# Patient Record
Sex: Female | Born: 1985 | Race: Black or African American | Hispanic: No | Marital: Single | Smoking: Never smoker
Health system: Southern US, Community
[De-identification: ages and names within clinical notes are randomized; demographics above are authoritative.]

## PROBLEM LIST (undated history)

## (undated) ENCOUNTER — Inpatient Hospital Stay (HOSPITAL_COMMUNITY): Payer: Self-pay

## (undated) DIAGNOSIS — D649 Anemia, unspecified: Secondary | ICD-10-CM

## (undated) DIAGNOSIS — F329 Major depressive disorder, single episode, unspecified: Secondary | ICD-10-CM

## (undated) DIAGNOSIS — F32A Depression, unspecified: Secondary | ICD-10-CM

## (undated) HISTORY — PX: NO PAST SURGERIES: SHX2092

---

## 2018-03-02 ENCOUNTER — Inpatient Hospital Stay (HOSPITAL_COMMUNITY)
Admission: AD | Admit: 2018-03-02 | Discharge: 2018-03-06 | DRG: 832 | Disposition: A | Discharge status: Home / Self Care | Payer: Medicaid Other | Attending: Obstetrics & Gynecology | Admitting: Obstetrics & Gynecology

## 2018-03-02 ENCOUNTER — Inpatient Hospital Stay (HOSPITAL_BASED_OUTPATIENT_CLINIC_OR_DEPARTMENT_OTHER): Payer: Medicaid Other

## 2018-03-02 ENCOUNTER — Encounter (HOSPITAL_COMMUNITY): Payer: Self-pay | Admitting: Emergency Medicine

## 2018-03-02 DIAGNOSIS — O0933 Supervision of pregnancy with insufficient antenatal care, third trimester: Secondary | ICD-10-CM

## 2018-03-02 DIAGNOSIS — Z6791 Unspecified blood type, Rh negative: Secondary | ICD-10-CM

## 2018-03-02 DIAGNOSIS — O0993 Supervision of high risk pregnancy, unspecified, third trimester: Secondary | ICD-10-CM

## 2018-03-02 DIAGNOSIS — O98313 Other infections with a predominantly sexual mode of transmission complicating pregnancy, third trimester: Secondary | ICD-10-CM | POA: Diagnosis present

## 2018-03-02 DIAGNOSIS — O99820 Streptococcus B carrier state complicating pregnancy: Secondary | ICD-10-CM | POA: Diagnosis present

## 2018-03-02 DIAGNOSIS — Z3A31 31 weeks gestation of pregnancy: Secondary | ICD-10-CM | POA: Diagnosis not present

## 2018-03-02 DIAGNOSIS — O30043 Twin pregnancy, dichorionic/diamniotic, third trimester: Secondary | ICD-10-CM

## 2018-03-02 DIAGNOSIS — O26893 Other specified pregnancy related conditions, third trimester: Secondary | ICD-10-CM | POA: Diagnosis present

## 2018-03-02 DIAGNOSIS — O4700 False labor before 37 completed weeks of gestation, unspecified trimester: Secondary | ICD-10-CM | POA: Diagnosis present

## 2018-03-02 DIAGNOSIS — T7491XA Unspecified adult maltreatment, confirmed, initial encounter: Secondary | ICD-10-CM | POA: Diagnosis present

## 2018-03-02 DIAGNOSIS — O479 False labor, unspecified: Secondary | ICD-10-CM | POA: Diagnosis not present

## 2018-03-02 DIAGNOSIS — O289 Unspecified abnormal findings on antenatal screening of mother: Secondary | ICD-10-CM | POA: Diagnosis not present

## 2018-03-02 DIAGNOSIS — Z3689 Encounter for other specified antenatal screening: Secondary | ICD-10-CM

## 2018-03-02 DIAGNOSIS — O4703 False labor before 37 completed weeks of gestation, third trimester: Secondary | ICD-10-CM

## 2018-03-02 DIAGNOSIS — A5901 Trichomonal vulvovaginitis: Secondary | ICD-10-CM | POA: Diagnosis present

## 2018-03-02 DIAGNOSIS — A599 Trichomoniasis, unspecified: Secondary | ICD-10-CM | POA: Diagnosis present

## 2018-03-02 DIAGNOSIS — Z789 Other specified health status: Secondary | ICD-10-CM | POA: Diagnosis present

## 2018-03-02 DIAGNOSIS — O288 Other abnormal findings on antenatal screening of mother: Secondary | ICD-10-CM

## 2018-03-02 HISTORY — DX: Anemia, unspecified: D64.9

## 2018-03-02 HISTORY — DX: Major depressive disorder, single episode, unspecified: F32.9

## 2018-03-02 HISTORY — DX: Depression, unspecified: F32.A

## 2018-03-02 LAB — URINALYSIS, ROUTINE W REFLEX MICROSCOPIC
Bilirubin Urine: NEGATIVE
GLUCOSE, UA: NEGATIVE mg/dL
Ketones, ur: 20 mg/dL — AB
Nitrite: NEGATIVE
Protein, ur: 30 mg/dL — AB
RBC / HPF: >50 RBC/hpf — ABNORMAL HIGH (ref 0–5)
Specific Gravity, Urine: 1.017 (ref 1.005–1.030)
WBC, UA: >50 WBC/hpf — ABNORMAL HIGH (ref 0–5)
pH: 6 (ref 5.0–8.0)

## 2018-03-02 LAB — FETAL FIBRONECTIN: Fetal Fibronectin: NEGATIVE

## 2018-03-02 LAB — WET PREP, GENITAL
Clue Cells Wet Prep HPF POC: NONE SEEN
Sperm: NONE SEEN
Yeast Wet Prep HPF POC: NONE SEEN

## 2018-03-02 LAB — RAPID URINE DRUG SCREEN, HOSP PERFORMED
Amphetamines: NOT DETECTED
Barbiturates: NOT DETECTED
Benzodiazepines: NOT DETECTED
Cocaine: NOT DETECTED
Opiates: NOT DETECTED
Tetrahydrocannabinol: NOT DETECTED

## 2018-03-02 LAB — AMNISURE RUPTURE OF MEMBRANE (ROM) NOT AT ARMC: Amnisure ROM: NEGATIVE

## 2018-03-02 MED ORDER — METRONIDAZOLE 500 MG PO TABS
2000.0000 mg | ORAL_TABLET | Freq: Once | ORAL | Status: AC
  Administered 2018-03-02: 2000 mg via ORAL
  Filled 2018-03-02: qty 4

## 2018-03-02 MED ORDER — BETAMETHASONE SOD PHOS & ACET 6 (3-3) MG/ML IJ SUSP
12.0000 mg | Freq: Once | INTRAMUSCULAR | Status: AC
  Administered 2018-03-02: 12 mg via INTRAMUSCULAR
  Filled 2018-03-02: qty 2

## 2018-03-02 MED ORDER — LACTATED RINGERS IV BOLUS
1000.0000 mL | Freq: Once | INTRAVENOUS | Status: AC
  Administered 2018-03-02: 1000 mL via INTRAVENOUS

## 2018-03-02 MED ORDER — NIFEDIPINE 10 MG PO CAPS
10.0000 mg | ORAL_CAPSULE | ORAL | Status: AC
  Administered 2018-03-02 (×3): 10 mg via ORAL
  Filled 2018-03-02 (×3): qty 1
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
## 2018-03-03 ENCOUNTER — Encounter (HOSPITAL_COMMUNITY): Payer: Self-pay

## 2018-03-03 ENCOUNTER — Inpatient Hospital Stay (HOSPITAL_BASED_OUTPATIENT_CLINIC_OR_DEPARTMENT_OTHER): Payer: Medicaid Other

## 2018-03-03 ENCOUNTER — Other Ambulatory Visit: Payer: Self-pay

## 2018-03-03 DIAGNOSIS — O30043 Twin pregnancy, dichorionic/diamniotic, third trimester: Secondary | ICD-10-CM

## 2018-03-03 DIAGNOSIS — O479 False labor, unspecified: Secondary | ICD-10-CM | POA: Diagnosis present

## 2018-03-03 DIAGNOSIS — O289 Unspecified abnormal findings on antenatal screening of mother: Secondary | ICD-10-CM | POA: Diagnosis not present

## 2018-03-03 DIAGNOSIS — Z789 Other specified health status: Secondary | ICD-10-CM | POA: Diagnosis present

## 2018-03-03 DIAGNOSIS — T7491XA Unspecified adult maltreatment, confirmed, initial encounter: Secondary | ICD-10-CM | POA: Diagnosis present

## 2018-03-03 DIAGNOSIS — O4700 False labor before 37 completed weeks of gestation, unspecified trimester: Secondary | ICD-10-CM | POA: Diagnosis present

## 2018-03-03 DIAGNOSIS — O26893 Other specified pregnancy related conditions, third trimester: Secondary | ICD-10-CM | POA: Diagnosis present

## 2018-03-03 DIAGNOSIS — O0933 Supervision of pregnancy with insufficient antenatal care, third trimester: Secondary | ICD-10-CM

## 2018-03-03 DIAGNOSIS — A5901 Trichomonal vulvovaginitis: Secondary | ICD-10-CM | POA: Diagnosis present

## 2018-03-03 DIAGNOSIS — Z3A31 31 weeks gestation of pregnancy: Secondary | ICD-10-CM

## 2018-03-03 DIAGNOSIS — Z6791 Unspecified blood type, Rh negative: Secondary | ICD-10-CM | POA: Diagnosis not present

## 2018-03-03 DIAGNOSIS — O99820 Streptococcus B carrier state complicating pregnancy: Secondary | ICD-10-CM | POA: Diagnosis present

## 2018-03-03 DIAGNOSIS — A599 Trichomoniasis, unspecified: Secondary | ICD-10-CM | POA: Diagnosis present

## 2018-03-03 DIAGNOSIS — O98313 Other infections with a predominantly sexual mode of transmission complicating pregnancy, third trimester: Secondary | ICD-10-CM | POA: Diagnosis present

## 2018-03-03 LAB — CBC
HCT: 26.2 % — ABNORMAL LOW (ref 36.0–46.0)
Hemoglobin: 7.9 g/dL — ABNORMAL LOW (ref 12.0–15.0)
MCH: 22.9 pg — AB (ref 26.0–34.0)
MCHC: 30.2 g/dL (ref 30.0–36.0)
MCV: 75.9 fL — ABNORMAL LOW (ref 80.0–100.0)
Platelets: 283 10*3/uL (ref 150–400)
RBC: 3.45 MIL/uL — ABNORMAL LOW (ref 3.87–5.11)
RDW: 14.5 % (ref 11.5–15.5)
WBC: 7.1 10*3/uL (ref 4.0–10.5)
nRBC: 0 % (ref 0.0–0.2)

## 2018-03-03 LAB — DIFFERENTIAL
Basophils Absolute: 0 10*3/uL (ref 0.0–0.1)
Basophils Relative: 0 %
EOS PCT: 0 %
Eosinophils Absolute: 0 10*3/uL (ref 0.0–0.5)
LYMPHS PCT: 14 %
Lymphs Abs: 1 10*3/uL (ref 0.7–4.0)
Monocytes Absolute: 0.1 10*3/uL (ref 0.1–1.0)
Monocytes Relative: 1 %
Neutro Abs: 6 10*3/uL (ref 1.7–7.7)
Neutrophils Relative %: 85 %

## 2018-03-03 LAB — HEMOGLOBIN A1C
Hgb A1c MFr Bld: 5.3 % (ref 4.8–5.6)
Mean Plasma Glucose: 105.41 mg/dL

## 2018-03-03 LAB — TYPE AND SCREEN
ABO/RH(D): A NEG
Antibody Screen: NEGATIVE

## 2018-03-03 LAB — HEPATITIS B SURFACE ANTIGEN: Hepatitis B Surface Ag: NEGATIVE

## 2018-03-03 LAB — ABO/RH: ABO/RH(D): A NEG

## 2018-03-03 MED ORDER — LACTATED RINGERS IV SOLN
INTRAVENOUS | Status: DC
  Administered 2018-03-03 – 2018-03-04 (×5): via INTRAVENOUS
  Administered 2018-03-04: 999 mL via INTRAVENOUS

## 2018-03-03 MED ORDER — ACETAMINOPHEN 325 MG PO TABS: 650.0000 mg | ORAL_TABLET | ORAL | Status: DC | PRN

## 2018-03-03 MED ORDER — BETAMETHASONE SOD PHOS & ACET 6 (3-3) MG/ML IJ SUSP
12.0000 mg | Freq: Once | INTRAMUSCULAR | Status: AC
  Administered 2018-03-03: 12 mg via INTRAMUSCULAR
  Filled 2018-03-03: qty 2

## 2018-03-03 MED ORDER — ZOLPIDEM TARTRATE 5 MG PO TABS: 5.0000 mg | ORAL_TABLET | Freq: Every evening | ORAL | Status: DC | PRN

## 2018-03-03 MED ORDER — NIFEDIPINE ER OSMOTIC RELEASE 30 MG PO TB24
30.0000 mg | ORAL_TABLET | Freq: Two times a day (BID) | ORAL | Status: DC
  Administered 2018-03-03 – 2018-03-06 (×8): 30 mg via ORAL
  Filled 2018-03-03 (×8): qty 1

## 2018-03-03 MED ORDER — CALCIUM CARBONATE ANTACID 500 MG PO CHEW: 2.0000 | CHEWABLE_TABLET | ORAL | Status: DC | PRN

## 2018-03-03 MED ORDER — DOCUSATE SODIUM 100 MG PO CAPS
100.0000 mg | ORAL_CAPSULE | Freq: Every day | ORAL | Status: DC
  Administered 2018-03-04 – 2018-03-06 (×3): 100 mg via ORAL
  Filled 2018-03-03 (×3): qty 1

## 2018-03-03 MED ORDER — PRENATAL MULTIVITAMIN CH
1.0000 | ORAL_TABLET | Freq: Every day | ORAL | Status: DC
  Administered 2018-03-03 – 2018-03-06 (×4): 1 via ORAL
  Filled 2018-03-03 (×4): qty 1
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
## 2018-03-04 LAB — HIV ANTIBODY (ROUTINE TESTING W REFLEX): HIV Screen 4th Generation wRfx: NONREACTIVE

## 2018-03-04 LAB — CULTURE, BETA STREP (GROUP B ONLY)

## 2018-03-04 LAB — RUBELLA SCREEN: Rubella: 2.51 index (ref >0.99)

## 2018-03-04 LAB — RPR: RPR Ser Ql: NONREACTIVE

## 2018-03-04 LAB — CULTURE, OB URINE: Culture: 30000 — AB

## 2018-03-04 LAB — OB RESULTS CONSOLE GBS: GBS: POSITIVE
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
## 2018-03-05 DIAGNOSIS — Z789 Other specified health status: Secondary | ICD-10-CM

## 2018-03-05 DIAGNOSIS — O99820 Streptococcus B carrier state complicating pregnancy: Secondary | ICD-10-CM

## 2018-03-05 LAB — GC/CHLAMYDIA PROBE AMP (~~LOC~~) NOT AT ARMC
Chlamydia: NEGATIVE
Neisseria Gonorrhea: NEGATIVE

## 2018-03-05 NOTE — Progress Notes (Signed)
Bedside US done for position.  A: maternal left, vtx B: maternal right, vtx   Baldemar LenisK. Meryl Marranda Arakelian, M.D. Attending Center for [facility] Midwife(Faculty Practice)

## 2018-03-05 NOTE — Progress Notes (Addendum)
FACULTY PRACTICE ANTEPARTUM PROGRESS NOTE  Marco Adelson is a 32 y.o. [id number] at [redacted]w[redacted]d who is admitted for di/di twins with pre-term contractions.  Estimated Date of Delivery: 04/30/18 Fetal presentation is cephalic and variable.  Length of Stay:  2 Days. Admitted 03/02/2018  Subjective: Still awaiting disposition planning, no room available yet at shelters contracted for today. Patient reports normal fetal movement.  She denies uterine contractions, denies bleeding and leaking of fluid per vagina. States she is feeling well.   Vitals:  Blood pressure 123/72, pulse (!) 121, temperature 99.5 F (37.5 C), resp. rate 18, height 5\' 6"  (1.676 m), weight 87.5 kg, last menstrual period 07/24/2017, SpO2 98 %. Physical Examination: CONSTITUTIONAL: Well-developed, well-nourished female in no acute distress.  HENT:  Normocephalic, atraumatic, External right and left ear normal. Oropharynx is clear and moist EYES: Conjunctivae and EOM are normal. Pupils are equal, round, and reactive to light. No scleral icterus.  NECK: Normal range of motion, supple, no masses. SKIN: Skin is warm and dry. No rash noted. Not diaphoretic. No erythema. No pallor. NEUROLGIC: Alert and oriented to person, place, and time. Normal reflexes, muscle tone coordination. No cranial nerve deficit noted. PSYCHIATRIC: Normal mood and affect. Normal behavior. Normal judgment and thought content. CARDIOVASCULAR: Normal heart rate noted, regular rhythm RESPIRATORY: Effort and breath sounds normal, no problems with respiration noted MUSCULOSKELETAL: Normal range of motion. No edema and no tenderness. ABDOMEN: Soft, nontender, nondistended, gravid. CERVIX: deferred  Fetal monitoring:  Twin A: FHR: 130 bpm, Variability: moderate, Accelerations: Present, Decelerations: Absent  Twin B: FHR: 130 bpm, Variability: moderate, Accelerations: Present, Decelerations: Absent   Uterine activity: Rare contractions  Current scheduled  medications . docusate sodium  100 mg Oral Daily  . NIFEdipine  30 mg Oral BID  . prenatal multivitamin  1 tablet Oral Q1200   Results for orders placed or performed during the hospital encounter of 03/02/18 (from the past 72 hour(s))  Urinalysis, Routine w reflex microscopic     Status: Abnormal   Collection Time: 03/02/18  9:13 PM  Result Value Ref Range   Color, Urine AMBER (A) YELLOW    Comment: BIOCHEMICALS MAY BE AFFECTED BY COLOR   APPearance CLOUDY (A) CLEAR   Specific Gravity, Urine 1.017 1.005 - 1.030   pH 6.0 5.0 - 8.0   Glucose, UA NEGATIVE NEGATIVE mg/dL   Hgb urine dipstick SMALL (A) NEGATIVE   Bilirubin Urine NEGATIVE NEGATIVE   Ketones, ur 20 (A) NEGATIVE mg/dL   Protein, ur 30 (A) NEGATIVE mg/dL   Nitrite NEGATIVE NEGATIVE   Leukocytes, UA LARGE (A) NEGATIVE   RBC / HPF >50 (H) 0 - 5 RBC/hpf   WBC, UA >50 (H) 0 - 5 WBC/hpf   Bacteria, UA MANY (A) NONE SEEN   Squamous Epithelial / LPF 6-10 0 - 5   Mucus PRESENT     Comment: Performed at [facility], [address] [place], [place] [address]  Urine rapid drug screen (hosp performed)     Status: None   Collection Time: 03/02/18  9:13 PM  Result Value Ref Range   Opiates NONE DETECTED NONE DETECTED   Cocaine NONE DETECTED NONE DETECTED   Benzodiazepines NONE DETECTED NONE DETECTED   Amphetamines NONE DETECTED NONE DETECTED   Tetrahydrocannabinol NONE DETECTED NONE DETECTED   Barbiturates NONE DETECTED NONE DETECTED    Comment: (NOTE) DRUG SCREEN FOR MEDICAL PURPOSES ONLY.  IF CONFIRMATION IS NEEDED FOR ANY PURPOSE, NOTIFY LAB WITHIN 5 DAYS. LOWEST DETECTABLE LIMITS FOR  URINE DRUG SCREEN Drug Class                     Cutoff (ng/mL) Amphetamine and metabolites    1000 Barbiturate and metabolites    200 Benzodiazepine                 200 Tricyclics and metabolites     300 Opiates and metabolites        300 Cocaine and metabolites        300 THC                            50 Performed at [facility], [address] [place], [place] [address]   Culture, [place] Urine     Status: Abnormal   Collection Time: 03/02/18  9:14 PM  Result Value Ref Range   Specimen Description      URINE, CLEAN CATCH Performed at [facility], [address], [place], [place] [address]    Special Requests      NONE Performed at [facility], [address], [place], [place] [address]    Culture (A)     30,000 COLONIES/mL MULTIPLE SPECIES PRESENT, SUGGEST RECOLLECTION NO GROUP B STREP (S.AGALACTIAE) ISOLATED Performed at [facility], [address], [place], [place] [address]    Report Status 03/04/2018 FINAL   Wet prep, genital     Status: Abnormal   Collection Time: 03/02/18  9:28 PM  Result Value Ref Range   Yeast Wet Prep HPF POC NONE SEEN NONE SEEN   Trich, Wet Prep PRESENT (A) NONE SEEN   Clue Cells Wet Prep HPF POC NONE SEEN NONE SEEN   WBC, Wet Prep HPF POC MANY (A) NONE SEEN    Comment: MODERATE BACTERIA SEEN   Sperm NONE SEEN     Comment: Performed at [facility], [address] [place], [place] [address]  Fetal fibronectin     Status: None   Collection Time: 03/02/18  9:28 PM  Result Value Ref Range   Fetal Fibronectin NEGATIVE NEGATIVE    Comment: Performed at [facility], [address] [place], [place] [address]  Amnisure rupture of membrane (rom)not at [facility]     Status: None   Collection Time: 03/02/18  9:28 PM  Result Value Ref Range   Amnisure ROM NEGATIVE     Comment: Performed at [facility], [address] [place], [place] [address]  Type and screen [facility] [place]     Status: None   Collection Time: 03/02/18 10:31 PM  Result Value Ref Range   ABO/RH(D) A NEG    Antibody Screen NEG    Sample Expiration      03/05/2018 Performed at [facility], [address], [place], [place] [address]   ABO/Rh     Status: None   Collection Time: 03/02/18 10:31 PM  Result Value Ref Range   ABO/RH(D)      A NEG Performed at  [facility], [address], [place], [place] [address]   Culture, beta strep (group b only)     Status: Abnormal   Collection Time: 03/03/18  2:59 AM  Result Value Ref Range   Specimen Description VAGINAL/RECTAL    Special Requests      NONE Performed at [facility], [address] [place], [place] [address]    Culture (A)     GROUP B STREP(S.AGALACTIAE)ISOLATED TESTING AGAINST S. AGALACTIAE NOT  ROUTINELY PERFORMED DUE TO PREDICTABILITY OF AMP/PEN/VAN SUSCEPTIBILITY. CRITICAL RESULT CALLED TO, READ BACK BY AND VERIFIED WITH: RN [facility] HOPPENBAUER [phone] FCP    Report Status 03/04/2018 FINAL   Hepatitis B surface antigen     Status: None   Collection Time: 03/03/18  6:06 AM  Result Value Ref Range   Hepatitis B Surface Ag Negative Negative    Comment: (NOTE) Performed At: [facility] [address] [place], [place] [id number] Jolene Schimke MD [id number]   Rubella screen     Status: None   Collection Time: 03/03/18  6:06 AM  Result Value Ref Range   Rubella 2.51 Immune >0.99 index    Comment: (NOTE)                                Non-immune       <0.90                                Equivocal  0.90 - 0.99                                Immune           >0.99 Performed At: [facility] [address] [place], [place] [id number] Jolene Schimke MD [id number]   RPR     Status: None   Collection Time: 03/03/18  6:06 AM  Result Value Ref Range   RPR Ser Ql Non Reactive Non Reactive    Comment: (NOTE) Performed At: [facility] [address] [place], [place] [id number] Jolene Schimke MD PI:[id number]   CBC     Status: Abnormal   Collection Time: 03/03/18  6:06 AM  Result Value Ref Range   WBC 7.1 4.0 - 10.5 K/uL   RBC 3.45 (L) 3.87 - 5.11 MIL/uL   Hemoglobin 7.9 (L) 12.0 - 15.0 g/dL   HCT 63.0 (L) 16.0 - 10.9 %   MCV 75.9 (L) 80.0 - 100.0 fL   MCH 22.9 (L) 26.0 - 34.0 pg   MCHC 30.2 30.0 - 36.0 g/dL   RDW 32.3 55.7 -  32.2 %   Platelets 283 150 - 400 K/uL   nRBC 0.0 0.0 - 0.2 %    Comment: Performed at [facility], [address], [place], [place] [address]  Differential     Status: None   Collection Time: 03/03/18  6:06 AM  Result Value Ref Range   Neutrophils Relative % 85 %   Neutro Abs 6.0 1.7 - 7.7 K/uL   Lymphocytes Relative 14 %   Lymphs Abs 1.0 0.7 - 4.0 K/uL   Monocytes Relative 1 %   Monocytes Absolute 0.1 0.1 - 1.0 K/uL   Eosinophils Relative 0 %   Eosinophils Absolute 0.0 0.0 - 0.5 K/uL   Basophils Relative 0 %   Basophils Absolute 0.0 0.0 - 0.1 K/uL    Comment: Performed at [facility], [address] [place], [place] [address]  HIV antibody (routine testing)     Status: None   Collection Time: 03/03/18  6:06 AM  Result Value Ref Range   HIV Screen 4th Generation wRfx Non Reactive Non Reactive    Comment: (NOTE) Performed At: [facility] [address] [place], [place] [id number] Jolene Schimke MD [id number]   Hemoglobin A1c  Status: None   Collection Time: 03/03/18  6:06 AM  Result Value Ref Range   Hgb A1c MFr Bld 5.3 4.8 - 5.6 %    Comment: (NOTE) Pre diabetes:          5.7%-6.4% Diabetes:              >6.4% Glycemic control for   <7.0% adults with diabetes    Mean Plasma Glucose 105.41 mg/dL    Comment: Performed at [facility], [address] [place], [place] [address]  OB RESULT CONSOLE Group B Strep     Status: None   Collection Time: 03/04/18 10:00 AM  Result Value Ref Range   GBS Positive     [place] Mfm Fetal Bpp Wo Non Stress  Result Date: 03/03/2018 ----------------------------------------------------------------------  OBSTETRICS REPORT                       (Signed Final 03/03/2018 12:15 pm) ---------------------------------------------------------------------- Patient Info  ID #:       [id number]                          D.O.B.:  October 20, 1985 (32 yrs)  Name:       Kennieth RadLATOYA NICOL Kovaleski              Visit Date: 03/02/2018 11:35 pm  ---------------------------------------------------------------------- Performed By  Performed By:     Marcellina MillinKelly L Moser          Ref. Address:      [address]                    [place]                                                              [address] [place],                                                              [place] [id number]  Attending:        Noralee Spaceavi Shankar MD        Secondary Phy.:    MAU Nursing-                                                              MAU/Triage  Referred By:      Juleen ChinaHEATHER D              [facility]          [facility]                    HOGAN CNM ---------------------------------------------------------------------- Orders   #  Description                          Code  Ordered By   1  [place] MFM FETAL BPP WO NON              [id number]     HEATHER HOGAN      STRESS   2  [place] MFM FETAL BPP WO NST              76819.1      HEATHER HOGAN      ADDL GESTATION  ----------------------------------------------------------------------   #  Order #                    Accession #                 Episode #   1  [id number]                  [id number]                  [id number]   2  [id number]                  [id number]                  [id number]  ---------------------------------------------------------------------- Indications   Twin pregnancy, di/di, third trimester         O30.043   [redacted] weeks gestation of pregnancy                Z3A.31   Insufficient Prenatal Care                     O09.30   Abdominal pain in pregnancy                    O99.89   Non-reactive NST                               O28.9  ---------------------------------------------------------------------- Fetal Evaluation (Fetus A)  Num Of Fetuses:          2  Preg. Location:          [address]  Fetal Heart Rate(bpm):   132  Cardiac Activity:        Observed  Fetal Lie:               Lower left Fetus  Presentation:            Cephalic  Placenta:                Fundal  P. Cord Insertion:       Visualized  Membrane Desc:      Dividing  Membrane seen - Dichorionic.  Amniotic Fluid  AFI FV:      Within normal limits                              Largest Pocket(cm)                              4 ---------------------------------------------------------------------- Biophysical Evaluation (Fetus A)  Amniotic F.V:   Within normal limits       F. Tone:         Observed  F. Movement:    Observed                   Score:  6/8  F. Breathing:   Not Observed ---------------------------------------------------------------------- OB History  Gravidity:    3         Term:   1        Prem:   1        SAB:   0  TOP:          0       Ectopic:  0        Living: 2 ---------------------------------------------------------------------- Gestational Age (Fetus A)  Best:          31w 5d     Det. By:  Marcella Dubs         EDD:   04/29/18 ---------------------------------------------------------------------- Anatomy (Fetus A)  Stomach:               Appears normal, left   Bladder:                Appears normal                         sided  Kidneys:               Appear normal ---------------------------------------------------------------------- Fetal Evaluation (Fetus B)  Num Of Fetuses:          2  Fetal Heart Rate(bpm):   130  Cardiac Activity:        Observed  Fetal Lie:               Upper right Fetus  Presentation:            Variable  Placenta:                Anterior  P. Cord Insertion:       Visualized  Membrane Desc:      Dividing Membrane seen - Dichorionic.  Amniotic Fluid  AFI FV:      Subjectively upper-normal                              Largest Pocket(cm)                              8 ---------------------------------------------------------------------- Biophysical Evaluation (Fetus B)  Amniotic F.V:   Within normal limits       F. Tone:         Observed  F. Movement:    Observed                   Score:           8/8  F. Breathing:   Observed ---------------------------------------------------------------------- Gestational Age (Fetus B)   Best:          31w 5d     Det. By:  Marcella Dubs         EDD:   04/29/18 ---------------------------------------------------------------------- Anatomy (Fetus B)  Stomach:               Appears normal, left   Bladder:                Appears normal                         sided  Kidneys:               Appear normal ---------------------------------------------------------------------- Impression  Patient was evaluated in the  MAU for c/o uterine contractions  and pelvic pain. She has not had regular prenatal care.  Dichorionic-diamniotic twin pregnancy.  Twin A: Lower fetus, cephalic, fundal placenta. Amniotic fluid  is normal and good fetal activity is seen. Fetal breathing  movements were not seen over 30-min observation. BPP 6/8.  Twin B: Lower fetus, variable lie, anterior placenta. Amniotic  fluid is normal and good fetal activity is seen. Antenatal  testing is reassuring. BPP 8/8. ----------------------------------------------------------------------                  Noralee Space, MD Electronically Signed Final Report   03/03/2018 12:15 pm ----------------------------------------------------------------------  [place] Mfm Ob Detail +14 Wk  Result Date: 03/03/2018 ----------------------------------------------------------------------  OBSTETRICS REPORT                       (Signed Final 03/03/2018 06:30 pm) ---------------------------------------------------------------------- Patient Info  ID #:       [id number]                          D.O.B.:  1985-10-22 (32 yrs)  Name:       Kennieth Rad Eagon              Visit Date: 03/03/2018 12:42 pm ---------------------------------------------------------------------- Performed By  Performed By:     Birdena Crandall        Ref. Address:      [address]                    [place]                                                              [place],                                                              [place] [address]  Attending:        Noralee Space MD         Secondary Phy.:    3rd Nursing- HR                                                              OB                                                              3rd Floor  Referred By:      Juleen China              Location:          [facility]  HOGAN CNM ---------------------------------------------------------------------- Orders   #  Description                          Code         Ordered By   1  [place] MFM OB DETAIL +14 WK              [id number]     HEATHER HOGAN   2  [place] MFM OB DETAIL ADDL GEST           76811.02     HEATHER HOGAN      +14 WK  ----------------------------------------------------------------------   #  Order #                    Accession #                 Episode #   1  [id number]                  [id number]                  [id number]   2  [id number]                  [id number]                  [id number]  ---------------------------------------------------------------------- Indications   Twin pregnancy, di/di, third trimester         O30.043   Insufficient Prenatal Care                     O09.30   Abdominal pain in pregnancy                    O99.89   Non-reactive NST                               O28.9   [redacted] weeks gestation of pregnancy                Z3A.31  ---------------------------------------------------------------------- Fetal Evaluation (Fetus A)  Num Of Fetuses:          2  Fetal Heart Rate(bpm):   151  Cardiac Activity:        Observed  Fetal Lie:               Lower Fetus, presenting fetus  Presentation:            Cephalic  Placenta:                Fundal  P. Cord Insertion:       Visualized, central  Amniotic Fluid  AFI FV:      Within normal limits                              Largest Pocket(cm)                              7.55  Comment:    Breathing and movements visualized. ---------------------------------------------------------------------- Biometry (Fetus A)  BPD:      75.1  mm     G. Age:  30w 1d          5  %    CI:  69.73   %    70 - 86                                                           FL/HC:       19.8  %    19.1 - 21.3  HC:       287   mm     G. Age:  31w 4d         10  %    HC/AC:       1.06       0.96 - 1.17  AC:      270.3  mm     G. Age:  31w 1d         27  %    FL/BPD:      75.6  %    71 - 87  FL:       56.8  mm     G. Age:  29w 6d          3  %    FL/AC:       21.0  %    20 - 24  CER:      40.5  mm     G. Age:  34w 4d         84  %  Est. FW:    1618   gm     3 lb 9 oz     32  %     FW Discordancy        13  % ---------------------------------------------------------------------- OB History  Gravidity:    3         Term:   1        Prem:   1        SAB:   0  TOP:          0       Ectopic:  0        Living: 2 ---------------------------------------------------------------------- Gestational Age (Fetus A)  U/S Today:     30w 5d                                        EDD:   05/07/18  Best:          31w 6d     Det. By:  Marcella Dubs         EDD:   04/29/18 ---------------------------------------------------------------------- Anatomy (Fetus A)  Cranium:               Appears normal         Aortic Arch:            Previously seen  Cavum:                 Appears normal         Ductal Arch:            Not well visualized  Ventricles:            Appears normal         Diaphragm:              Appears normal  Choroid  Plexus:        Appears normal         Stomach:                Appears normal, left                                                                        sided  Cerebellum:            Appears normal         Abdomen:                Appears normal  Posterior Fossa:       Appears normal         Abdominal Wall:         Appears nml (cord                                                                        insert, abd wall)  Nuchal Fold:           Not applicable (>20    Cord Vessels:           Appears normal ([redacted]                         wks GA)                                        vessel cord)  Face:                  Appears normal          Kidneys:                Appear normal                         (orbits and profile)  Lips:                  Appears normal         Bladder:                Appears normal  Thoracic:              Appears normal         Spine:                  Appears normal  Heart:                 Appears normal         Upper Extremities:      Appears normal                         (4CH, axis, and situs  RVOT:  Previously seen        Lower Extremities:      Appears normal  LVOT:                  Previously seen  Other:  Fetus appears to be female. Nasal bone visualized. ---------------------------------------------------------------------- Fetal Evaluation (Fetus B)  Num Of Fetuses:          2  Fetal Heart Rate(bpm):   131  Cardiac Activity:        Observed  Fetal Lie:               Upper Fetus  Presentation:            Cephalic  Placenta:                Anterior  P. Cord Insertion:       Visualized, central  Amniotic Fluid  AFI FV:      Within normal limits                              Largest Pocket(cm)                              6.86  Comment:    Breathing and movements visualzed ---------------------------------------------------------------------- Biometry (Fetus B)  BPD:        78  mm     G. Age:  31w 2d         24  %    CI:        72.36   %    70 - 86                                                          FL/HC:       21.0  %    19.1 - 21.3  HC:      291.7  mm     G. Age:  32w 1d         22  %    HC/AC:       1.04       0.96 - 1.17  AC:      279.8  mm     G. Age:  32w 0d         53  %    FL/BPD:      78.6  %    71 - 87  FL:       61.3  mm     G. Age:  31w 6d         36  %    FL/AC:       21.9  %    20 - 24  CER:      39.2  mm     G. Age:  33w 1d         72  %  Est. FW:    1864   gm     4 lb 2 oz     57  %     FW Discordancy     0 \ 13 % ---------------------------------------------------------------------- Gestational Age (Fetus B)  U/S Today:     31w 6d  EDD:   04/29/18   Best:          31w 6d     Det. By:  Marcella Dubs         EDD:   04/29/18 ---------------------------------------------------------------------- Anatomy (Fetus B)  Cranium:               Appears normal         Aortic Arch:            Appears normal  Cavum:                 Appears normal         Ductal Arch:            Appears normal  Ventricles:            Appears normal         Diaphragm:              Appears normal  Choroid Plexus:        Appears normal         Stomach:                Appears normal, left                                                                        sided  Cerebellum:            Appears normal         Abdomen:                Appears normal  Posterior Fossa:       Appears normal         Abdominal Wall:         Appears nml (cord                                                                        insert, abd wall)  Nuchal Fold:           Not applicable (>20    Cord Vessels:           Appears normal ([redacted]                         wks GA)                                        vessel cord)  Face:                  Appears normal         Kidneys:                Appear normal                         (orbits and profile)  Lips:                  Appears normal         Bladder:                Appears normal  Thoracic:              Appears normal         Spine:                  Appears normal  Heart:                 Appears normal         Upper Extremities:      Visualized                         (4CH, axis, and situs  RVOT:                  Appears normal         Lower Extremities:      Appears normal  LVOT:                  Appears normal  Other:  Fetus appears to be a female. Nasal bone visualized. Hands and feet          visualized. ---------------------------------------------------------------------- Cervix Uterus Adnexa  Cervix  Normal appearance by transabdominal scan.  Uterus  No abnormality visualized. ---------------------------------------------------------------------- Impression   Dichorionic-diamniotic twin pregnancy.  Twin A: Lower fetus, cephalic, fundal placenta. Amniotic fluid  is normal and good fetal activity is seen. Fetal growth is  appropriate for gestational age. Fetal anatomy appears  normal, but limited by advanced gestational age.  Twin B: Lower fetus, variable lie, anterior placenta. Amniotic  fluid is normal and good fetal activity is seen. Fetal growth is  appropriate for gestational age. Fetal anatomy appears  normal, but limited by advanced gestational age.  Growth discordancy: 13% (normal). ----------------------------------------------------------------------                  Noralee Space, MD Electronically Signed Final Report   03/03/2018 06:30 pm ----------------------------------------------------------------------  [place] Mfm Ob Detail Addl Gest +14 Wk  Result Date: 03/03/2018 ----------------------------------------------------------------------  OBSTETRICS REPORT                       (Signed Final 03/03/2018 06:30 pm)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

## 2018-03-05 NOTE — Progress Notes (Signed)
CSW contacted [facility] to inquire about bed availability, staff reported no bed availability.   CSW followed up with patient at bedside regarding shelter. CSW provided update and inquired about patient's support system. Patient reported no local support and that her only support is her sister in [place]. CSW and patient [facility], staff member Steward DroneBrenda reported no room availability. CSW agreed to keep searching for shelter for patient. CSW will contact [facility] later today when staff is available. CSW awaiting return call [facility].   CSW will continue to seek shelter for patient.  Celso SickleKimberly Willim Turnage, LCSWA Clinical Social Worker [facility] Cell#: [phone]

## 2018-03-05 NOTE — Progress Notes (Addendum)
CSW assisting patient with seeking shelter. CSW contacted the following places to inquire about bed availability. CSW with patient at bedside making calls.   [facility] [place] [phone] - staff reported that they are at capacity at both locations  [facility] [place] [phone]) - staff reported that they are at capacity  [facility] [place] [place] [phone]) - staff reported that they are capacity  [facility] [place] [phone]) - staff reported that they are at capacity and reported that they may have availability tomorrow  [facility] [phone]) - staff reported that they are capacity  [facility] [phone]) - staff reported that they are capacity  [facility] [phone]) - no answer, CSW left voicemail requesting return phone call.   CSW will continue to seek shelter for patient. Patient aware of no bed availability at this time.   Margaret SickleKimberly Mckenley Tran, LCSWA Clinical Social Worker [facility] Cell#: [phone]

## 2018-03-05 NOTE — Progress Notes (Signed)
CSW contacted [facility] to see if they had a bed available for patient, no answer. CSW left voicemail requesting return phone call.   [facility], no answer. CSW left voicemail requesting return phone call.   CSW will continue to seek shelter for patient.  Celso SickleKimberly Derionna Salvador, LCSWA Clinical Social Worker [facility] Cell#: [phone]

## 2018-03-06 DIAGNOSIS — O0993 Supervision of high risk pregnancy, unspecified, third trimester: Secondary | ICD-10-CM

## 2018-03-06 DIAGNOSIS — Z6791 Unspecified blood type, Rh negative: Secondary | ICD-10-CM

## 2018-03-06 DIAGNOSIS — O26893 Other specified pregnancy related conditions, third trimester: Secondary | ICD-10-CM | POA: Diagnosis present

## 2018-03-06 MED ORDER — RHO D IMMUNE GLOBULIN 1500 UNIT/2ML IJ SOSY
300.0000 ug | PREFILLED_SYRINGE | Freq: Once | INTRAMUSCULAR | Status: AC
  Administered 2018-03-06: 300 ug via INTRAVENOUS
  Filled 2018-03-06: qty 2

## 2018-03-06 MED ORDER — NIFEDIPINE ER 30 MG PO TB24: 30.0000 mg | ORAL_TABLET | Freq: Two times a day (BID) | ORAL | 2 refills | Status: DC

## 2018-03-06 MED ORDER — NIFEDIPINE ER 30 MG PO TB24: 30.0000 mg | ORAL_TABLET | Freq: Two times a day (BID) | ORAL | 2 refills | Status: AC

## 2018-03-06 NOTE — Discharge Summary (Signed)
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          [facility] ---------------------------------------------------------------------- Orders   #  Description                          Code         Ordered By   1  [place] MFM OB DETAIL +14 WK              76811.01     HEATHER HOGAN   2  [place] MFM OB DETAIL ADDL GEST           19147.82     HEATHER HOGAN      +14 WK  ----------------------------------------------------------------------   #  Order #                    Accession #  Episode #   1  [id number]                  [id number]                  [id number]   2  [id number]                  [id number]                  [id number]   ---------------------------------------------------------------------- Indications   Twin pregnancy, di/di, third trimester         O30.043   Insufficient Prenatal Care                     O09.30   Abdominal pain in pregnancy                    O99.89   Non-reactive NST                               O28.9   [redacted] weeks gestation of pregnancy                Z3A.31  ---------------------------------------------------------------------- Fetal Evaluation (Fetus A)  Num Of Fetuses:          2  Fetal Heart Rate(bpm):   151  Cardiac Activity:        Observed  Fetal Lie:               Lower Fetus, presenting fetus  Presentation:            Cephalic  Placenta:                Fundal  P. Cord Insertion:       Visualized, central  Amniotic Fluid  AFI FV:      Within normal limits                              Largest Pocket(cm)                              7.55  Comment:    Breathing and movements visualized. ---------------------------------------------------------------------- Biometry (Fetus A)  BPD:      75.1  mm     G. Age:  30w 1d          5  %    CI:        69.73   %    70 - 86                                                          FL/HC:       19.8  %    19.1 - 21.3  HC:       287   mm     G. Age:  31w 4d         10  %    HC/AC:       1.06       0.96 - 1.17  AC:      270.3  mm     G. Age:  31w  1d         27  %    FL/BPD:      75.6  %    71 - 87  FL:       56.8  mm     G. Age:  29w 6d          3  %    FL/AC:       21.0  %    20 - 24  CER:      40.5  mm     G. Age:  34w 4d         84  %  Est. FW:    1618   gm     3 lb 9 oz     32  %     FW Discordancy        13  % ---------------------------------------------------------------------- OB History  Gravidity:    3         Term:   1        Prem:   1        SAB:   0  TOP:          0       Ectopic:  0        Living: 2 ---------------------------------------------------------------------- Gestational Age (Fetus A)  U/S Today:     30w 5d                                        EDD:    05/07/18  Best:          31w 6d     Det. By:  Marcella Dubs         EDD:   04/29/18 ---------------------------------------------------------------------- Anatomy (Fetus A)  Cranium:               Appears normal         Aortic Arch:            Previously seen  Cavum:                 Appears normal         Ductal Arch:            Not well visualized  Ventricles:            Appears normal         Diaphragm:              Appears normal  Choroid Plexus:        Appears normal         Stomach:                Appears normal, left                                                                        sided  Cerebellum:            Appears normal         Abdomen:                Appears normal  Posterior Fossa:  Appears normal         Abdominal Wall:         Appears nml (cord                                                                        insert, abd wall)  Nuchal Fold:           Not applicable (>20    Cord Vessels:           Appears normal ([redacted]                         wks GA)                                        vessel cord)  Face:                  Appears normal         Kidneys:                Appear normal                         (orbits and profile)  Lips:                  Appears normal         Bladder:                Appears normal  Thoracic:              Appears normal         Spine:                  Appears normal  Heart:                 Appears normal         Upper Extremities:      Appears normal                         (4CH, axis, and situs  RVOT:                  Previously seen        Lower Extremities:      Appears normal  LVOT:                  Previously seen  Other:  Fetus appears to be female. Nasal bone visualized. ---------------------------------------------------------------------- Fetal Evaluation (Fetus B)  Num Of Fetuses:          2  Fetal Heart Rate(bpm):   131  Cardiac Activity:        Observed  Fetal Lie:               Upper Fetus  Presentation:            Cephalic  Placenta:                 Anterior  P. Cord Insertion:       Visualized, central  Amniotic Fluid  AFI FV:      Within normal limits                              Largest Pocket(cm)                              6.86  Comment:    Breathing and movements visualzed ---------------------------------------------------------------------- Biometry (Fetus B)  BPD:        78  mm     G. Age:  31w 2d         24  %    CI:        72.36   %    70 - 86                                                          FL/HC:       21.0  %    19.1 - 21.3  HC:      291.7  mm     G. Age:  32w 1d         22  %    HC/AC:       1.04       0.96 - 1.17  AC:      279.8  mm     G. Age:  32w 0d         53  %    FL/BPD:      78.6  %    71 - 87  FL:       61.3  mm     G. Age:  31w 6d         36  %    FL/AC:       21.9  %    20 - 24  CER:      39.2  mm     G. Age:  33w 1d         72  %  Est. FW:    1864   gm     4 lb 2 oz     57  %     FW Discordancy     0 \ 13 % ---------------------------------------------------------------------- Gestational Age (Fetus B)  U/S Today:     31w 6d                                        EDD:   04/29/18  Best:          31w 6d     Det. By:  Marcella Dubs         EDD:   04/29/18 ---------------------------------------------------------------------- Anatomy (Fetus B)  Cranium:               Appears normal         Aortic Arch:            Appears normal  Cavum:                 Appears normal         Ductal Arch:  Appears normal  Ventricles:            Appears normal         Diaphragm:              Appears normal  Choroid Plexus:        Appears normal         Stomach:                Appears normal, left                                                                        sided  Cerebellum:            Appears normal         Abdomen:                Appears normal  Posterior Fossa:       Appears normal         Abdominal Wall:         Appears nml (cord                                                                        insert, abd  wall)  Nuchal Fold:           Not applicable (>20    Cord Vessels:           Appears normal ([redacted]                         wks GA)                                        vessel cord)  Face:                  Appears normal         Kidneys:                Appear normal                         (orbits and profile)  Lips:                  Appears normal         Bladder:                Appears normal  Thoracic:              Appears normal         Spine:                  Appears normal  Heart:                 Appears normal         Upper Extremities:  Visualized                         (4CH, axis, and situs  RVOT:                  Appears normal         Lower Extremities:      Appears normal  LVOT:                  Appears normal  Other:  Fetus appears to be a female. Nasal bone visualized. Hands and feet          visualized. ---------------------------------------------------------------------- Cervix Uterus Adnexa  Cervix  Normal appearance by transabdominal scan.  Uterus  No abnormality visualized. ---------------------------------------------------------------------- Impression  Dichorionic-diamniotic twin pregnancy.  Twin A: Lower fetus, cephalic, fundal placenta. Amniotic fluid  is normal and good fetal activity is seen. Fetal growth is  appropriate for gestational age. Fetal anatomy appears  normal, but limited by advanced gestational age.  Twin B: Lower fetus, variable lie, anterior placenta. Amniotic  fluid is normal and good fetal activity is seen. Fetal growth is  appropriate for gestational age. Fetal anatomy appears  normal, but limited by advanced gestational age.  Growth discordancy: 13% (normal). ----------------------------------------------------------------------                  Noralee Spaceavi Shankar, MD Electronically Signed Final Report   03/03/2018 06:30 pm ----------------------------------------------------------------------  [place] Mfm Fetal Bpp Wo Nst Addl Gestation  Result Date:  03/03/2018 ----------------------------------------------------------------------  OBSTETRICS REPORT                       (Signed Final 03/03/2018 12:15 pm) ---------------------------------------------------------------------- Patient Info  ID #:       [id number]                          D.O.B.:  01/22/1986 (32 yrs)  Name:       Margaret Tran              Visit Date: 03/02/2018 11:35 pm ---------------------------------------------------------------------- Performed By  Performed By:     Marcellina MillinKelly L Moser          Ref. Address:      [address]                    [place]                                                              [place],                                                              [place] [id number]  Attending:        Noralee Spaceavi Shankar MD        Secondary Phy.:    MAU Nursing-  MAU/Triage  Referred By:      Juleen China              Location:          [facility] ---------------------------------------------------------------------- Orders   #  Description                          Code         Ordered By   1  [place] MFM FETAL BPP WO NON              76819.01     HEATHER HOGAN      STRESS   2  [place] MFM FETAL BPP WO NST              76819.1      HEATHER HOGAN      ADDL GESTATION  ----------------------------------------------------------------------   #  Order #                    Accession #                 Episode #   1  [id number]                  [id number]                  [id number]   2  [id number]                  [id number]                  [id number]  ---------------------------------------------------------------------- Indications   Twin pregnancy, di/di, third trimester         O30.043   [redacted] weeks gestation of pregnancy                Z3A.31   Insufficient Prenatal Care                     O09.30   Abdominal pain in pregnancy                    O99.89   Non-reactive NST                               O28.9   ---------------------------------------------------------------------- Fetal Evaluation (Fetus A)  Num Of Fetuses:          2  Preg. Location:          [address]  Fetal Heart Rate(bpm):   132  Cardiac Activity:        Observed  Fetal Lie:               Lower left Fetus  Presentation:            Cephalic  Placenta:                Fundal  P. Cord Insertion:       Visualized  Membrane Desc:      Dividing Membrane seen - Dichorionic.  Amniotic Fluid  AFI FV:      Within normal limits  Largest Pocket(cm)                              4 ---------------------------------------------------------------------- Biophysical Evaluation (Fetus A)  Amniotic F.V:   Within normal limits       F. Tone:         Observed  F. Movement:    Observed                   Score:           6/8  F. Breathing:   Not Observed ---------------------------------------------------------------------- OB History  Gravidity:    3         Term:   1        Prem:   1        SAB:   0  TOP:          0       Ectopic:  0        Living: 2 ---------------------------------------------------------------------- Gestational Age (Fetus A)  Best:          31w 5d     Det. By:  Marcella Dubs         EDD:   04/29/18 ---------------------------------------------------------------------- Anatomy (Fetus A)  Stomach:               Appears normal, left   Bladder:                Appears normal                         sided  Kidneys:               Appear normal ---------------------------------------------------------------------- Fetal Evaluation (Fetus B)  Num Of Fetuses:          2  Fetal Heart Rate(bpm):   130  Cardiac Activity:        Observed  Fetal Lie:               Upper right Fetus  Presentation:            Variable  Placenta:                Anterior  P. Cord Insertion:       Visualized  Membrane Desc:      Dividing Membrane seen - Dichorionic.  Amniotic Fluid  AFI FV:      Subjectively upper-normal                              Largest  Pocket(cm)                              8 ---------------------------------------------------------------------- Biophysical Evaluation (Fetus B)  Amniotic F.V:   Within normal limits       F. Tone:         Observed  F. Movement:    Observed                   Score:           8/8  F. Breathing:   Observed ---------------------------------------------------------------------- Gestational Age (Fetus B)  Best:          31w 5d     Det. By:  Marcella Dubs  EDD:   04/29/18 ---------------------------------------------------------------------- Anatomy (Fetus B)  Stomach:               Appears normal, left   Bladder:                Appears normal                         sided  Kidneys:               Appear normal ---------------------------------------------------------------------- Impression  Patient was evaluated in the MAU for c/o uterine contractions  and pelvic pain. She has not had regular prenatal care.  Dichorionic-diamniotic twin pregnancy.  Twin A: Lower fetus, cephalic, fundal placenta. Amniotic fluid  is normal and good fetal activity is seen. Fetal breathing  movements were not seen over 30-min observation. BPP 6/8.  Twin B: Lower fetus, variable lie, anterior placenta. Amniotic  fluid is normal and good fetal activity is seen. Antenatal  testing is reassuring. BPP 8/8. ----------------------------------------------------------------------                  Noralee Space, MD Electronically Signed Final Report   03/03/2018 12:15 pm ----------------------------------------------------------------------   Future Appointments  Date Time Provider Department Center  03/15/2018  9:55 AM Constant, Gigi Gin, MD [facility]    Discharge Condition: Stable  Discharge disposition: 01-Home or Self Care   Discharge Instructions    Discharge activity:  No Restrictions   Complete by:  As directed    Discharge diet:  No restrictions   Complete by:  As directed    Fetal Kick Count:  Lie on our left side for  one hour after a meal, and count the number of times your baby kicks.  If it is less than 5 times, get up, move around and drink some juice.  Repeat the test 30 minutes later.  If it is still less than 5 kicks in an hour, notify your doctor.   Complete by:  As directed    No sexual activity restrictions   Complete by:  As directed    Notify physician for a general feeling that "something is not right"   Complete by:  As directed    Notify physician for leaking of fluid   Complete by:  As directed    Notify physician for pelvic pressure   Complete by:  As directed    Notify physician for uterine contractions.  These may be painless and feel like the uterus is tightening or the baby is  "balling up"   Complete by:  As directed    Notify physician for vaginal bleeding   Complete by:  As directed    PRETERM LABOR:  Includes any of the follwing symptoms that occur between 20 - [redacted] weeks gestation.  If these symptoms are not stopped, preterm labor can result in preterm delivery, placing your baby at risk   Complete by:  As directed      Allergies as of 03/06/2018   No Known Allergies     Medication List    TAKE these medications   multivitamin with minerals Tabs tablet Take 1 tablet by mouth daily.   NIFEdipine 30 MG 24 hr tablet Commonly known as:  ADALAT CC Take 1 tablet (30 mg total) by mouth 2 (two) times daily.        Signed: Jaynie Collins M.D. 03/06/2018, 11:30 AM

## 2018-03-06 NOTE — Progress Notes (Signed)
CSW secured safe and stable housing at [facility] [place], [place] . CSW will assist with transportation (taxi) and requested that bedside nurse contact CSW when MOB is medically ready for discharge.   Blaine HamperAngel Boyd-Gilyard, MSW, LCSW Clinical Social Work [phone]

## 2018-03-06 NOTE — Progress Notes (Signed)
FACULTY PRACTICE ANTEPARTUM PROGRESS NOTE  Margaret Tran is a 32 y.o. [phone] at [redacted]w[redacted]d who is admitted for di/di twins with pre-term contractions and unsafe home situation.  Estimated Date of Delivery: 04/30/18 Fetal presentation is cephalic and variable.  Length of Stay:  3 Days. Admitted 03/02/2018  Subjective: Still awaiting disposition planning, no room available yet. Patient reports normal fetal movement.  She denies uterine contractions, denies bleeding and leaking of fluid per vagina. States she is feeling well.   Vitals:  Blood pressure 122/78, pulse (!) 101, temperature 97.8 F (36.6 C), temperature source Oral, resp. rate 18, height 5\' 6"  (1.676 m), weight 87.5 kg, last menstrual period 07/24/2017, SpO2 99 %. Physical Examination: CONSTITUTIONAL: Well-developed, well-nourished female in no acute distress.  HENT:  Normocephalic, atraumatic, External right and left ear normal. Oropharynx is clear and moist EYES: Conjunctivae and EOM are normal. Pupils are equal, round, and reactive to light. No scleral icterus.  NECK: Normal range of motion, supple, no masses. SKIN: Skin is warm and dry. No rash noted. Not diaphoretic. No erythema. No pallor. NEUROLGIC: Alert and oriented to person, place, and time. Normal reflexes, muscle tone coordination. No cranial nerve deficit noted. PSYCHIATRIC: Normal mood and affect. Normal behavior. Normal judgment and thought content. CARDIOVASCULAR: Normal heart rate noted, regular rhythm RESPIRATORY: Effort and breath sounds normal, no problems with respiration noted MUSCULOSKELETAL: Normal range of motion. No edema and no tenderness. ABDOMEN: Soft, nontender, nondistended, gravid. CERVIX: deferred  Fetal monitoring:  Twin A: FHR: 130 bpm, Variability: moderate, Accelerations: Present, Decelerations: Absent  Twin B: FHR: 130 bpm, Variability: moderate, Accelerations: Present, Decelerations: Absent   Uterine activity: Rare  Current scheduled  medications . docusate sodium  100 mg Oral Daily  . NIFEdipine  30 mg Oral BID  . prenatal multivitamin  1 tablet Oral Q1200  . rho (d) immune globulin  300 mcg Intravenous Once   Results for orders placed or performed during the hospital encounter of 03/02/18 (from the past 72 hour(s))  OB RESULT CONSOLE Group B Strep     Status: None   Collection Time: 03/04/18 10:00 AM  Result Value Ref Range   GBS Positive     [place] Mfm Fetal Bpp Wo Non Stress  Result Date: 03/03/2018 ----------------------------------------------------------------------  OBSTETRICS REPORT                       (Signed Final 03/03/2018 12:15 pm) ---------------------------------------------------------------------- Patient Info  ID #:       [id number]                          D.O.B.:  1985-10-25 (32 yrs)  Name:       Margaret Tran              Visit Date: 03/02/2018 11:35 pm ---------------------------------------------------------------------- Performed By  Performed By:     Marcellina Millin          Ref. Address:      [address]                    [place],                                                              [place] [id number]  Attending:        Noralee Spaceavi Shankar MD        Secondary Phy.:    MAU Nursing-                                                              MAU/Triage  Referred By:      Juleen ChinaHEATHER D              [facility]          [facility]                    [facility] ---------------------------------------------------------------------- Orders   #  Description                          Code         Ordered By   1  US MFM FETAL BPP WO NON              76819.01     HEATHER [facility]      STRESS   2  US MFM FETAL BPP WO NST              76819.1      HEATHER [facility]      ADDL GESTATION  ----------------------------------------------------------------------   #  Order #                    Accession #                 Episode #   1  [id number]                   [id number]   2  [id number]                  [id number]  ---------------------------------------------------------------------- Indications   Twin pregnancy, di/di, third trimester         O30.043   [redacted] weeks gestation of pregnancy                Z3A.31   Insufficient Prenatal Care                     O09.30   Abdominal pain in pregnancy                    O99.89   Non-reactive NST                               O28.9  ---------------------------------------------------------------------- Fetal Evaluation (Fetus A)  Num Of Fetuses:  2  Preg. Location:          [address]  Fetal Heart Rate(bpm):   132  Cardiac Activity:        Observed  Fetal Lie:               Lower left Fetus  Presentation:            Cephalic  Placenta:                Fundal  P. Cord Insertion:       Visualized  Membrane Desc:      Dividing Membrane seen - Dichorionic.  Amniotic Fluid  AFI FV:      Within normal limits                              Largest Pocket(cm)                              4 ---------------------------------------------------------------------- Biophysical Evaluation (Fetus A)  Amniotic F.V:   Within normal limits       F. Tone:         Observed  F. Movement:    Observed                   Score:           6/8  F. Breathing:   Not Observed ---------------------------------------------------------------------- OB History  Gravidity:    3         Term:   1        Prem:   1        SAB:   0  TOP:          0       Ectopic:  0        Living: 2 ---------------------------------------------------------------------- Gestational Age (Fetus A)  Best:          31w 5d     Det. By:  Marcella Dubs         EDD:   04/29/18 ---------------------------------------------------------------------- Anatomy (Fetus A)  Stomach:               Appears normal, left   Bladder:                Appears normal                         sided  Kidneys:               Appear normal  ---------------------------------------------------------------------- Fetal Evaluation (Fetus B)  Num Of Fetuses:          2  Fetal Heart Rate(bpm):   130  Cardiac Activity:        Observed  Fetal Lie:               Upper right Fetus  Presentation:            Variable  Placenta:                Anterior  P. Cord Insertion:       Visualized  Membrane Desc:      Dividing Membrane seen - Dichorionic.  Amniotic Fluid  AFI FV:      Subjectively upper-normal  Largest Pocket(cm)                              8 ---------------------------------------------------------------------- Biophysical Evaluation (Fetus B)  Amniotic F.V:   Within normal limits       F. Tone:         Observed  F. Movement:    Observed                   Score:           8/8  F. Breathing:   Observed ---------------------------------------------------------------------- Gestational Age (Fetus B)  Best:          31w 5d     Det. By:  Marcella Dubs         EDD:   04/29/18 ---------------------------------------------------------------------- Anatomy (Fetus B)  Stomach:               Appears normal, left   Bladder:                Appears normal                         sided  Kidneys:               Appear normal ---------------------------------------------------------------------- Impression  Patient was evaluated in the MAU for c/o uterine contractions  and pelvic pain. She has not had regular prenatal care.  Dichorionic-diamniotic twin pregnancy.  Twin A: Lower fetus, cephalic, fundal placenta. Amniotic fluid  is normal and good fetal activity is seen. Fetal breathing  movements were not seen over 30-min observation. BPP 6/8.  Twin B: Lower fetus, variable lie, anterior placenta. Amniotic  fluid is normal and good fetal activity is seen. Antenatal  testing is reassuring. BPP 8/8. ----------------------------------------------------------------------                  Noralee Space, MD Electronically Signed Final Report    03/03/2018 12:15 pm ----------------------------------------------------------------------  [place] Mfm Ob Detail +14 Wk  Result Date: 03/03/2018 ----------------------------------------------------------------------  OBSTETRICS REPORT                       (Signed Final 03/03/2018 06:30 pm) ---------------------------------------------------------------------- Patient Info  ID #:       [id number]                          D.O.B.:  03-10-1985 (32 yrs)  Name:       Margaret Tran              Visit Date: 03/03/2018 12:42 pm ---------------------------------------------------------------------- Performed By  Performed By:     Birdena Crandall        Ref. Address:      [address]                    [place]                                                              [place],  [place] [address]  Attending:        Noralee Space MD        Secondary Phy.:    3rd Nursing- HR                                                              OB                                                              3rd Floor  Referred By:      Juleen China              Location:          [facility] ---------------------------------------------------------------------- Orders   #  Description                          Code         Ordered By   1  [place] MFM OB DETAIL +14 WK              76811.01     HEATHER [facility]   2  [place] MFM OB DETAIL ADDL GEST           76811.02     HEATHER [facility]      +14 WK  ----------------------------------------------------------------------   #  Order #                    Accession #                 Episode #   1  [id number]                  [id number]                  [id number]   2  [id number]                  [id number]                  [id number]  ---------------------------------------------------------------------- Indications   Twin pregnancy, di/di, third trimester         O30.043   Insufficient Prenatal Care                      O09.30   Abdominal pain in pregnancy                    O99.89   Non-reactive NST                               O28.9   [redacted] weeks gestation of pregnancy                Z3A.31  ---------------------------------------------------------------------- Fetal Evaluation (Fetus A)  Num Of Fetuses:          2  Fetal Heart Rate(bpm):  151  Cardiac Activity:        Observed  Fetal Lie:               Lower Fetus, presenting fetus  Presentation:            Cephalic  Placenta:                Fundal  P. Cord Insertion:       Visualized, central  Amniotic Fluid  AFI FV:      Within normal limits                              Largest Pocket(cm)                              7.55  Comment:    Breathing and movements visualized. ---------------------------------------------------------------------- Biometry (Fetus A)  BPD:      75.1  mm     G. Age:  30w 1d          5  %    CI:        69.73   %    70 - 86                                                          FL/HC:       19.8  %    19.1 - 21.3  HC:       287   mm     G. Age:  31w 4d         10  %    HC/AC:       1.06       0.96 - 1.17  AC:      270.3  mm     G. Age:  31w 1d         27  %    FL/BPD:      75.6  %    71 - 87  FL:       56.8  mm     G. Age:  29w 6d          3  %    FL/AC:       21.0  %    20 - 24  CER:      40.5  mm     G. Age:  34w 4d         84  %  Est. FW:    1618   gm     3 lb 9 oz     32  %     FW Discordancy        13  % ---------------------------------------------------------------------- OB History  Gravidity:    3         Term:   1        Prem:   1        SAB:   0  TOP:          0       Ectopic:  0        Living: 2 ---------------------------------------------------------------------- Gestational Age (Fetus A)  U/S Today:     30w 5d  EDD:   05/07/18  Best:          31w 6d     Det. By:  Marcella Dubs         EDD:   04/29/18 ---------------------------------------------------------------------- Anatomy (Fetus A)  Cranium:                Appears normal         Aortic Arch:            Previously seen  Cavum:                 Appears normal         Ductal Arch:            Not well visualized  Ventricles:            Appears normal         Diaphragm:              Appears normal  Choroid Plexus:        Appears normal         Stomach:                Appears normal, left                                                                        sided  Cerebellum:            Appears normal         Abdomen:                Appears normal  Posterior Fossa:       Appears normal         Abdominal Wall:         Appears nml (cord                                                                        insert, abd wall)  Nuchal Fold:           Not applicable (>20    Cord Vessels:           Appears normal ([redacted]                         wks GA)                                        vessel cord)  Face:                  Appears normal         Kidneys:                Appear normal                         (orbits and profile)  Lips:                  Appears normal         Bladder:                Appears normal  Thoracic:              Appears normal         Spine:                  Appears normal  Heart:                 Appears normal         Upper Extremities:      Appears normal                         (4CH, axis, and situs  RVOT:                  Previously seen        Lower Extremities:      Appears normal  LVOT:                  Previously seen  Other:  Fetus appears to be female. Nasal bone visualized. ---------------------------------------------------------------------- Fetal Evaluation (Fetus B)  Num Of Fetuses:          2  Fetal Heart Rate(bpm):   131  Cardiac Activity:        Observed  Fetal Lie:               Upper Fetus  Presentation:            Cephalic  Placenta:                Anterior  P. Cord Insertion:       Visualized, central  Amniotic Fluid  AFI FV:      Within normal limits                              Largest Pocket(cm)                               6.86  Comment:    Breathing and movements visualzed ---------------------------------------------------------------------- Biometry (Fetus B)  BPD:        78  mm     G. Age:  31w 2d         24  %    CI:        72.36   %    70 - 86                                                          FL/HC:       21.0  %    19.1 - 21.3  HC:      291.7  mm     G. Age:  32w 1d         22  %    HC/AC:       1.04       0.96 - 1.17  AC:      279.8  mm  G. Age:  32w 0d         53  %    FL/BPD:      78.6  %    71 - 87  FL:       61.3  mm     G. Age:  31w 6d         36  %    FL/AC:       21.9  %    20 - 24  CER:      39.2  mm     G. Age:  33w 1d         72  %  Est. FW:    1864   gm     4 lb 2 oz     57  %     FW Discordancy     0 \ 13 % ---------------------------------------------------------------------- Gestational Age (Fetus B)  U/S Today:     31w 6d                                        EDD:   04/29/18  Best:          31w 6d     Det. By:  Marcella DubsEarly Ultrasound         EDD:   04/29/18 ---------------------------------------------------------------------- Anatomy (Fetus B)  Cranium:               Appears normal         Aortic Arch:            Appears normal  Cavum:                 Appears normal         Ductal Arch:            Appears normal  Ventricles:            Appears normal         Diaphragm:              Appears normal  Choroid Plexus:        Appears normal         Stomach:                Appears normal, left                                                                        sided  Cerebellum:            Appears normal         Abdomen:                Appears normal  Posterior Fossa:       Appears normal         Abdominal Wall:         Appears nml (cord  insert, abd wall)  Nuchal Fold:           Not applicable (>20    Cord Vessels:           Appears normal ([redacted]                         wks GA)                                        vessel cord)  Face:                   Appears normal         Kidneys:                Appear normal                         (orbits and profile)  Lips:                  Appears normal         Bladder:                Appears normal  Thoracic:              Appears normal         Spine:                  Appears normal  Heart:                 Appears normal         Upper Extremities:      Visualized                         (4CH, axis, and situs  RVOT:                  Appears normal         Lower Extremities:      Appears normal  LVOT:                  Appears normal  Other:  Fetus appears to be a female. Nasal bone visualized. Hands and feet          visualized. ---------------------------------------------------------------------- Cervix Uterus Adnexa  Cervix  Normal appearance by transabdominal scan.  Uterus  No abnormality visualized. ---------------------------------------------------------------------- Impression  Dichorionic-diamniotic twin pregnancy.  Twin A: Lower fetus, cephalic, fundal placenta. Amniotic fluid  is normal and good fetal activity is seen. Fetal growth is  appropriate for gestational age. Fetal anatomy appears  normal, but limited by advanced gestational age.  Twin B: Lower fetus, variable lie, anterior placenta. Amniotic  fluid is normal and good fetal activity is seen. Fetal growth is  appropriate for gestational age. Fetal anatomy appears  normal, but limited by advanced gestational age.  Growth discordancy: 13% (normal). ----------------------------------------------------------------------                  Noralee Space, MD Electronically Signed Final Report   03/03/2018 06:30 pm ----------------------------------------------------------------------  [place] Mfm Ob Detail Addl Gest +14 Wk  Result Date: 03/03/2018 ----------------------------------------------------------------------  OBSTETRICS REPORT                       (Signed Final 03/03/2018 06:30 pm)  ----------------------------------------------------------------------  Patient Info  ID #:       [id number]                          D.O.B.:  17-Jul-1985 (32 yrs)  Name:       Margaret Tran Level              Visit Date: 03/03/2018 12:42 pm ---------------------------------------------------------------------- Performed By  Performed By:     Birdena Crandall        Ref. Address:      [address]                    [place]                                                              [place],                                                              [place] [address]  Attending:        Noralee Space MD        Secondary Phy.:    3rd Nursing- HR                                                              OB                                                              3rd Floor  Referred By:      Juleen [place]              Location:          [facility] ---------------------------------------------------------------------- Orders   #  Description                          Code         Ordered By   1  [place] MFM OB DETAIL +14 WK              76811.01     HEATHER HOGAN   2  [place] MFM OB DETAIL ADDL GEST           19147.82     HEATHER HOGAN      +14 WK  ----------------------------------------------------------------------   #  Order #                    Accession #  Episode #   1  [id number]                  [id number]                  [id number]   2  [id number]                  [id number]                  [id number]  ---------------------------------------------------------------------- Indications   Twin pregnancy, di/di, third trimester         O30.043   Insufficient Prenatal Care                     O09.30   Abdominal pain in pregnancy                    O99.89   Non-reactive NST                               O28.9   [redacted] weeks gestation of pregnancy                Z3A.31  ---------------------------------------------------------------------- Fetal Evaluation (Fetus A)  Num Of Fetuses:           2  Fetal Heart Rate(bpm):   151  Cardiac Activity:        Observed  Fetal Lie:               Lower Fetus, presenting fetus  Presentation:            Cephalic  Placenta:                Fundal  P. Cord Insertion:       Visualized, central  Amniotic Fluid  AFI FV:      Within normal limits                              Largest Pocket(cm)                              7.55  Comment:    Breathing and movements visualized. ---------------------------------------------------------------------- Biometry (Fetus A)  BPD:      75.1  mm     G. Age:  30w 1d          5  %    CI:        69.73   %    70 - 86                                                          FL/HC:       19.8  %    19.1 - 21.3  HC:       287   mm     G. Age:  31w 4d         10  %    HC/AC:       1.06       0.96 - 1.17  AC:      270.3  mm     G. Age:  31w  1d         27  %    FL/BPD:      75.6  %    71 - 87  FL:       56.8  mm     G. Age:  29w 6d          3  %    FL/AC:       21.0  %    20 - 24  CER:      40.5  mm     G. Age:  34w 4d         84  %  Est. FW:    1618   gm     3 lb 9 oz     32  %     FW Discordancy        13  % ---------------------------------------------------------------------- OB History  Gravidity:    3         Term:   1        Prem:   1        SAB:   0  TOP:          0       Ectopic:  0        Living: 2 ---------------------------------------------------------------------- Gestational Age (Fetus A)  U/S Today:     30w 5d                                        EDD:   05/07/18  Best:          31w 6d     Det. By:  Marcella Dubs         EDD:   04/29/18 ---------------------------------------------------------------------- Anatomy (Fetus A)  Cranium:               Appears normal         Aortic Arch:            Previously seen  Cavum:                 Appears normal         Ductal Arch:            Not well visualized  Ventricles:            Appears normal         Diaphragm:              Appears normal  Choroid Plexus:        Appears normal          Stomach:                Appears normal, left                                                                        sided  Cerebellum:            Appears normal         Abdomen:                Appears normal  Posterior Fossa:  Appears normal         Abdominal Wall:         Appears nml (cord                                                                        insert, abd wall)  Nuchal Fold:           Not applicable (>20    Cord Vessels:           Appears normal ([redacted]                         wks GA)                                        vessel cord)  Face:                  Appears normal         Kidneys:                Appear normal                         (orbits and profile)  Lips:                  Appears normal         Bladder:                Appears normal  Thoracic:              Appears normal         Spine:                  Appears normal  Heart:                 Appears normal         Upper Extremities:      Appears normal                         (4CH, axis, and situs  RVOT:                  Previously seen        Lower Extremities:      Appears normal  LVOT:                  Previously seen  Other:  Fetus appears to be female. Nasal bone visualized. ---------------------------------------------------------------------- Fetal Evaluation (Fetus B)  Num Of Fetuses:          2  Fetal Heart Rate(bpm):   131  Cardiac Activity:        Observed  Fetal Lie:               Upper Fetus  Presentation:            Cephalic  Placenta:                Anterior  P. Cord Insertion:       Visualized, central  Amniotic  Fluid  AFI FV:      Within normal limits                              Largest Pocket(cm)                              6.86  Comment:    Breathing and movements visualzed ---------------------------------------------------------------------- Biometry (Fetus B)  BPD:        78  mm     G. Age:  31w 2d         24  %    CI:        72.36   %    70 - 86                                                           FL/HC:       21.0  %    19.1 - 21.3  HC:      291.7  mm     G. Age:  32w 1d         22  %    HC/AC:       1.04       0.96 - 1.17  AC:      279.8  mm     G. Age:  32w 0d         53  %    FL/BPD:      78.6  %    71 - 87  FL:       61.3  mm     G. Age:  31w 6d         36  %    FL/AC:       21.9  %    20 - 24  CER:      39.2  mm     G. Age:  33w 1d         72  %  Est. FW:    1864   gm     4 lb 2 oz     57  %     FW Discordancy     0 \ 13 % ---------------------------------------------------------------------- Gestational Age (Fetus B)  U/S Today:     31w 6d                                        EDD:   04/29/18  Best:          31w 6d     Det. By:  Marcella Dubs         EDD:   04/29/18 ---------------------------------------------------------------------- Anatomy (Fetus B)  Cranium:               Appears normal         Aortic Arch:            Appears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

## 2018-03-07 LAB — RH IG WORKUP (INCLUDES ABO/RH)
ABO/RH(D): A NEG
Gestational Age(Wks): 32
Unit division: 0

## 2018-03-08 ENCOUNTER — Telehealth: Payer: Self-pay | Admitting: Family Medicine

## 2018-03-08 NOTE — Telephone Encounter (Signed)
The patient does not have a working number. Updated the appointments and sending the patient a reminder letter.

## 2018-03-13 ENCOUNTER — Other Ambulatory Visit: Payer: Self-pay | Admitting: *Deleted

## 2018-03-13 DIAGNOSIS — O0993 Supervision of high risk pregnancy, unspecified, third trimester: Secondary | ICD-10-CM

## 2018-03-15 ENCOUNTER — Encounter: Payer: Medicaid Other | Admitting: Obstetrics and Gynecology

## 2018-03-15 ENCOUNTER — Encounter: Payer: Self-pay | Admitting: Obstetrics and Gynecology

## 2018-03-15 ENCOUNTER — Other Ambulatory Visit: Payer: Medicaid Other

## 2018-07-23 ENCOUNTER — Encounter (HOSPITAL_COMMUNITY): Payer: Self-pay

## 2019-04-26 IMAGING — US US MFM OB DETAIL EACH ADDL GEST+14 WK
1 series · 16 of 28 positions shown · non-contrast
Comparison: none

[Series 2: us mfm ob detail each addl gest+14 wk · 16 of 250 slices shown]
[im 1/250]
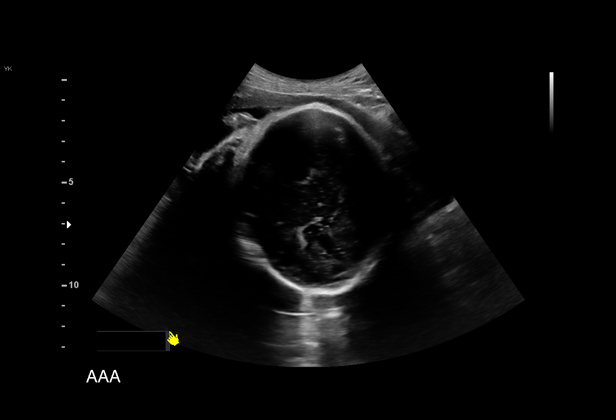
[im 19/250]
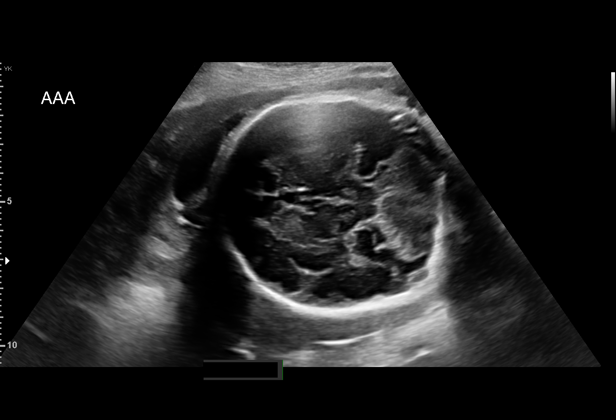
[im 37/250]
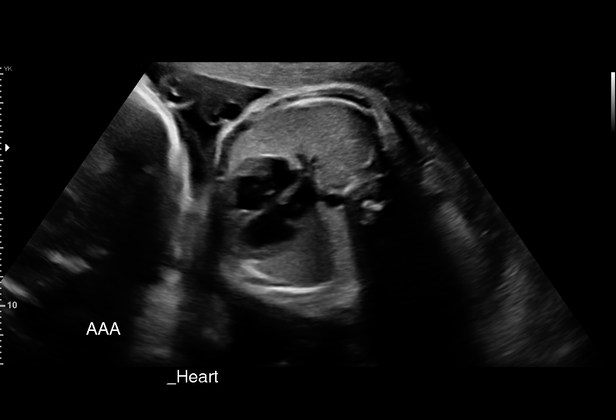
[im 56/250]
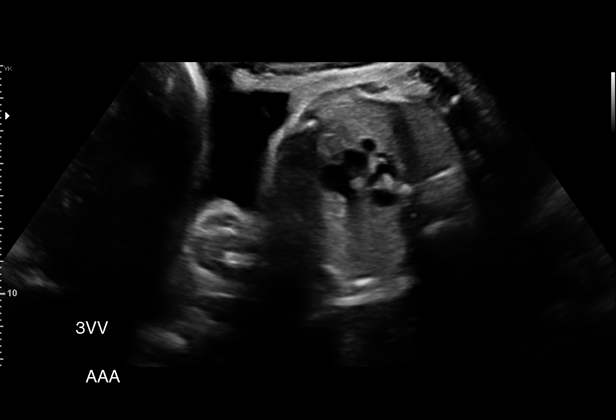
[im 65/250]
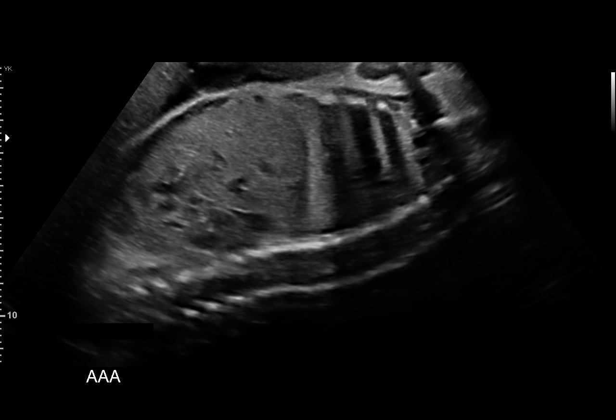
[im 84/250]
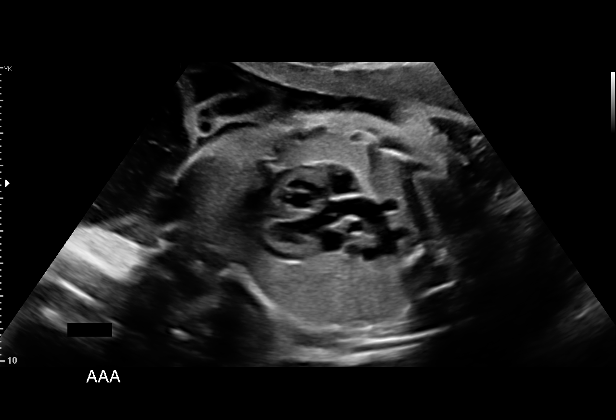
[im 102/250]
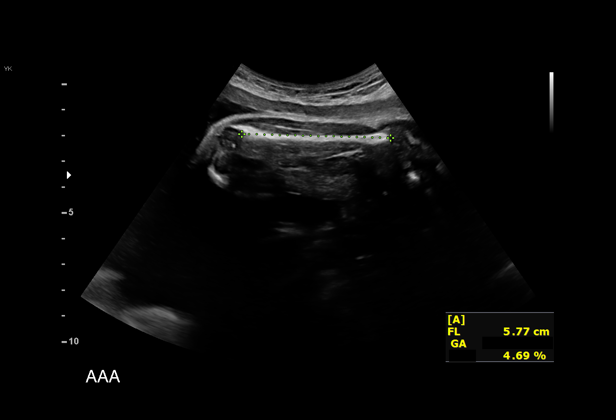
[im 120/250]
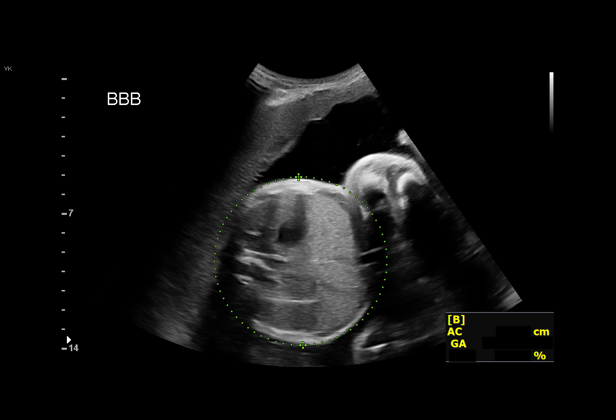
[im 130/250]
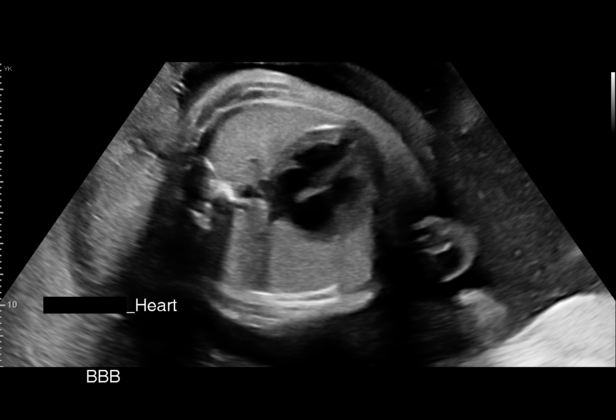
[im 148/250]
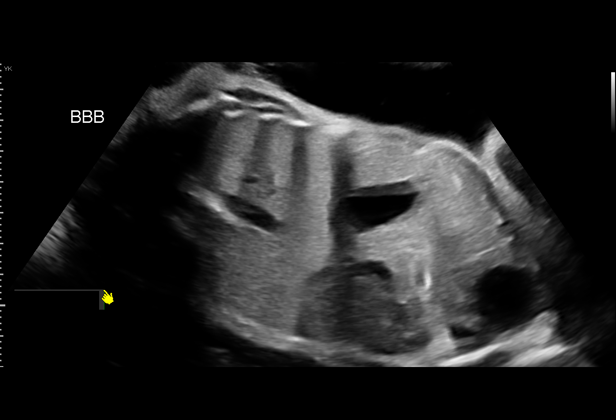
[im 167/250]
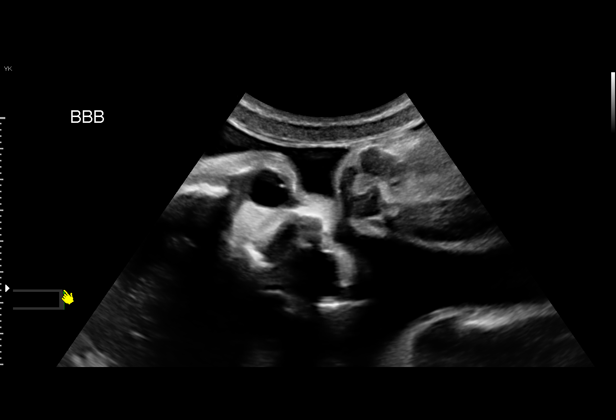
[im 185/250]
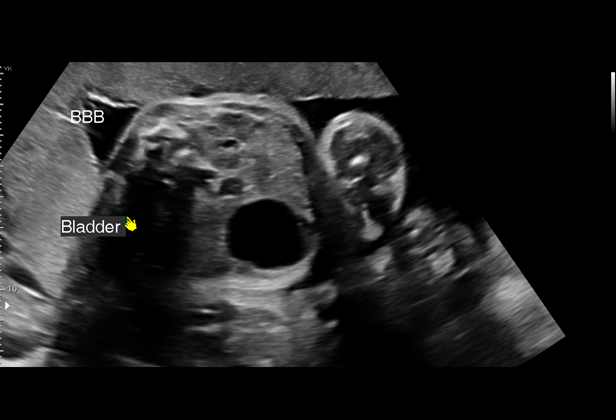
[im 194/250]
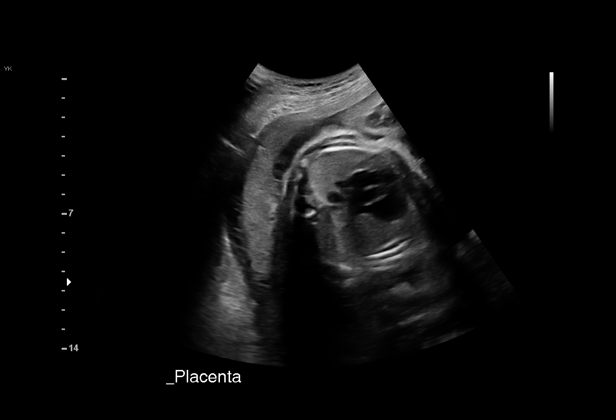
[im 213/250]
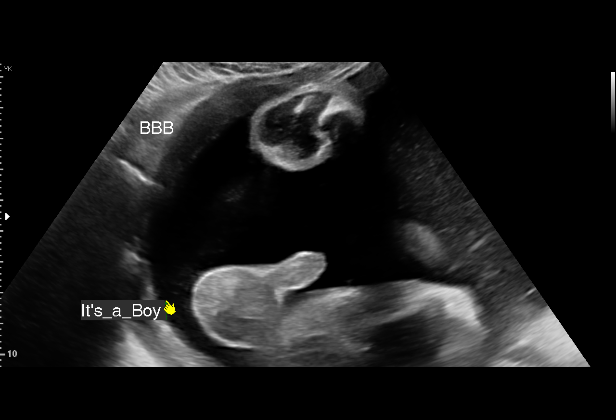
[im 231/250]
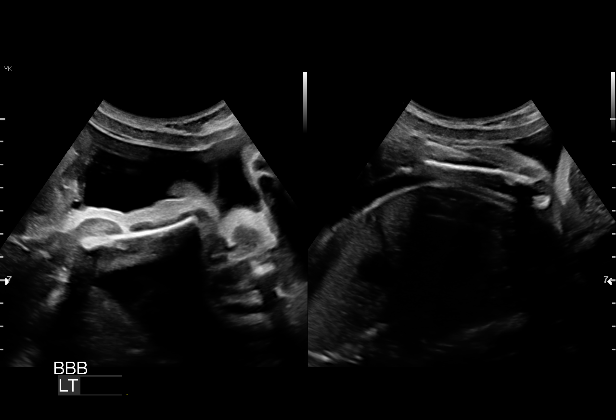
[im 250/250]
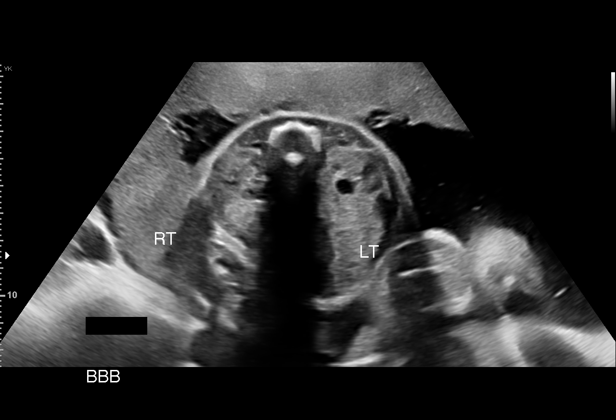

[16 of 28 positions shown; findings below may reference images not displayed]

[REDACTED]. [HOSPITAL],
 Attending:        Sherese Rendon        Secondary Phy.:    3rd Nursing- HR
                                                             OB
                   [facility] CNM

  1  US MFM OB DETAIL +14 WK              76811.01     GIORGI JUMPER
  2  US MFM OB DETAIL ADDL GEST           76811.02     GIORGI JUMPER
     +14 WK
 ----------------------------------------------------------------------

 ----------------------------------------------------------------------
Indications

  Twin pregnancy, di/di, third trimester
  Insufficient Prenatal Care
  Abdominal pain in pregnancy
  Non-reactive NST
  31 weeks gestation of pregnancy
 ----------------------------------------------------------------------
Fetal Evaluation (Fetus A)

 Num Of Fetuses:          2
 Fetal Heart Rate(bpm):   151
 Cardiac Activity:        Observed
 Fetal Lie:               Lower Fetus, presenting fetus
 Presentation:            Cephalic
 Placenta:                Fundal
 P. Cord Insertion:       Visualized, central

 Amniotic Fluid
 AFI FV:      Within normal limits

                             Largest Pocket(cm)


 Comment:    Breathing and movements visualized.
Biometry (Fetus A)

 BPD:      75.1  mm     G. Age:  30w 1d          5  %    CI:        69.73   %    70 - 86
                                                         FL/HC:       19.8  %    19.1 -
 HC:       287   mm     G. Age:  31w 4d         10  %    HC/AC:       1.06       0.96 -
 AC:      270.3  mm     G. Age:  31w 1d         27  %    FL/BPD:      75.6  %    71 - 87
 FL:       56.8  mm     G. Age:  29w 6d          3  %    FL/AC:       21.0  %    20 - 24
 CER:      40.5  mm     G. Age:  34w 4d         84  %

 Est. FW:    0104   gm     3 lb 9 oz     32  %     FW Discordancy        13  %
OB History

 Gravidity:    3         Term:   1        Prem:   1        SAB:   0
 TOP:          0       Ectopic:  0        Living: 2
Gestational Age (Fetus A)

 U/S Today:     30w 5d                                        EDD:   05/07/18
 Best:          31w 6d     Det. By:  Early Ultrasound         EDD:   04/29/18
Anatomy (Fetus A)

 Cranium:               Appears normal         Aortic Arch:            Previously seen
 Cavum:                 Appears normal         Ductal Arch:            Not well visualized
 Ventricles:            Appears normal         Diaphragm:              Appears normal
 Choroid Plexus:        Appears normal         Stomach:                Appears normal, left
                                                                       sided
 Cerebellum:            Appears normal         Abdomen:                Appears normal
 Posterior Fossa:       Appears normal         Abdominal Wall:         Appears nml (cord
                                                                       insert, abd wall)
 Nuchal Fold:           Not applicable (>20    Cord Vessels:           Appears normal (3
                        wks GA)                                        vessel cord)
 Face:                  Appears normal         Kidneys:                Appear normal
                        (orbits and profile)
 Lips:                  Appears normal         Bladder:                Appears normal
 Thoracic:              Appears normal         Spine:                  Appears normal
 Heart:                 Appears normal         Upper Extremities:      Appears normal
                        (4CH, axis, and situs
 RVOT:                  Previously seen        Lower Extremities:      Appears normal
 LVOT:                  Previously seen

 Other:  Fetus appears to be female. Nasal bone visualized.

Fetal Evaluation (Fetus B)

 Num Of Fetuses:          2
 Fetal Heart Rate(bpm):   131
 Cardiac Activity:        Observed
 Fetal Lie:               Upper Fetus
 Presentation:            Cephalic
 Placenta:                Anterior
 P. Cord Insertion:       Visualized, central

 Amniotic Fluid
 AFI FV:      Within normal limits

                             Largest Pocket(cm)


 Comment:    Breathing and movements visualzed
Biometry (Fetus B)
 BPD:        78  mm     G. Age:  31w 2d         24  %    CI:        72.36   %    70 - 86
                                                         FL/HC:       21.0  %    19.1 -
 HC:      291.7  mm     G. Age:  32w 1d         22  %    HC/AC:       1.04       0.96 -
 AC:      279.8  mm     G. Age:  32w 0d         53  %    FL/BPD:      78.6  %    71 - 87
 FL:       61.3  mm     G. Age:  31w 6d         36  %    FL/AC:       21.9  %    20 - 24
 CER:      39.2  mm     G. Age:  33w 1d         72  %

 Est. FW:    9685   gm     4 lb 2 oz     57  %     FW Discordancy     0 \ 13 %
Gestational Age (Fetus B)

 U/S Today:     31w 6d                                        EDD:   04/29/18
 Best:          31w 6d     Det. By:  Early Ultrasound         EDD:   04/29/18
Anatomy (Fetus B)

 Cranium:               Appears normal         Aortic Arch:            Appears normal
 Cavum:                 Appears normal         Ductal Arch:            Appears normal
 Ventricles:            Appears normal         Diaphragm:              Appears normal
 Choroid Plexus:        Appears normal         Stomach:                Appears normal, left
                                                                       sided
 Cerebellum:            Appears normal         Abdomen:                Appears normal
 Posterior Fossa:       Appears normal         Abdominal Wall:         Appears nml (cord
                                                                       insert, abd wall)
 Nuchal Fold:           Not applicable (>20    Cord Vessels:           Appears normal (3
                        wks GA)                                        vessel cord)
 Face:                  Appears normal         Kidneys:                Appear normal
                        (orbits and profile)
 Lips:                  Appears normal         Bladder:                Appears normal
 Thoracic:              Appears normal         Spine:                  Appears normal
 Heart:                 Appears normal         Upper Extremities:      Visualized
                        (4CH, axis, and situs
 RVOT:                  Appears normal         Lower Extremities:      Appears normal
 LVOT:                  Appears normal

 Other:  Fetus appears to be a male. Nasal bone visualized. Hands and feet
         visualized.
Cervix Uterus Adnexa

 Cervix
 Normal appearance by transabdominal scan.

 Uterus
 No abnormality visualized.
Impression

 Dichorionic-diamniotic twin pregnancy.

 Twin A: Lower fetus, cephalic, fundal placenta. Amniotic fluid
 is normal and good fetal activity is seen. Fetal growth is
 appropriate for gestational age. Fetal anatomy appears
 normal, but limited by advanced gestational age.

 Twin B: Lower fetus, variable lie, anterior placenta. Amniotic
 fluid is normal and good fetal activity is seen. Fetal growth is
 appropriate for gestational age. Fetal anatomy appears
 normal, but limited by advanced gestational age.

 Growth discordancy: 13% (normal).
                 Olopumatiqueraskar, Sakye Aaf
